# Patient Record
Sex: Male | Born: 1980 | Race: White | Hispanic: No | Smoking: Current every day smoker
Health system: Southern US, Community
[De-identification: ages and names within clinical notes are randomized; demographics above are authoritative.]

## PROBLEM LIST (undated history)

## (undated) HISTORY — PX: TONSILLECTOMY: SUR1361

## (undated) HISTORY — PX: HIP SURGERY: SHX245

## (undated) HISTORY — PX: KNEE SURGERY: SHX244

---

## 2001-01-28 ENCOUNTER — Emergency Department (HOSPITAL_COMMUNITY): Admission: EM | Admit: 2001-01-28 | Discharge: 2001-01-28 | Payer: Self-pay | Admitting: *Deleted

## 2001-01-28 ENCOUNTER — Encounter: Payer: Self-pay | Admitting: *Deleted

## 2001-05-02 ENCOUNTER — Emergency Department (HOSPITAL_COMMUNITY): Admission: EM | Admit: 2001-05-02 | Discharge: 2001-05-02 | Payer: Self-pay | Admitting: Emergency Medicine

## 2001-12-25 ENCOUNTER — Encounter: Payer: Self-pay | Admitting: *Deleted

## 2001-12-25 ENCOUNTER — Emergency Department (HOSPITAL_COMMUNITY): Admission: EM | Admit: 2001-12-25 | Discharge: 2001-12-25 | Payer: Self-pay | Admitting: *Deleted

## 2002-08-31 ENCOUNTER — Emergency Department (HOSPITAL_COMMUNITY): Admission: EM | Admit: 2002-08-31 | Discharge: 2002-09-01 | Payer: Self-pay | Admitting: Emergency Medicine

## 2003-06-17 ENCOUNTER — Encounter: Payer: Self-pay | Admitting: Emergency Medicine

## 2003-06-17 ENCOUNTER — Emergency Department (HOSPITAL_COMMUNITY): Admission: EM | Admit: 2003-06-17 | Discharge: 2003-06-17 | Payer: Self-pay | Admitting: Emergency Medicine

## 2004-04-03 ENCOUNTER — Emergency Department (HOSPITAL_COMMUNITY): Admission: EM | Admit: 2004-04-03 | Discharge: 2004-04-03 | Payer: Self-pay | Admitting: Emergency Medicine

## 2004-04-08 ENCOUNTER — Emergency Department (HOSPITAL_COMMUNITY): Admission: EM | Admit: 2004-04-08 | Discharge: 2004-04-08 | Payer: Self-pay | Admitting: Emergency Medicine

## 2004-06-02 ENCOUNTER — Emergency Department (HOSPITAL_COMMUNITY): Admission: EM | Admit: 2004-06-02 | Discharge: 2004-06-02 | Payer: Self-pay | Admitting: *Deleted

## 2004-06-08 ENCOUNTER — Emergency Department (HOSPITAL_COMMUNITY): Admission: EM | Admit: 2004-06-08 | Discharge: 2004-06-09 | Payer: Self-pay

## 2004-06-22 ENCOUNTER — Emergency Department (HOSPITAL_COMMUNITY): Admission: EM | Admit: 2004-06-22 | Discharge: 2004-06-22 | Payer: Self-pay

## 2004-08-21 ENCOUNTER — Emergency Department (HOSPITAL_COMMUNITY): Admission: EM | Admit: 2004-08-21 | Discharge: 2004-08-21 | Payer: Self-pay | Admitting: *Deleted

## 2006-07-26 IMAGING — CR DG CHEST 2V
3 series · 3 of 3 positions shown · non-contrast
Comparison: none

CLINICAL DATA: Chest pain

CHEST - TWO VIEW
FINDINGS
No previous for comparison. Lungs are clear. Heart size normal. Mild central peribronchial
thickening. No effusion.

[view not recorded (1 of 3)]
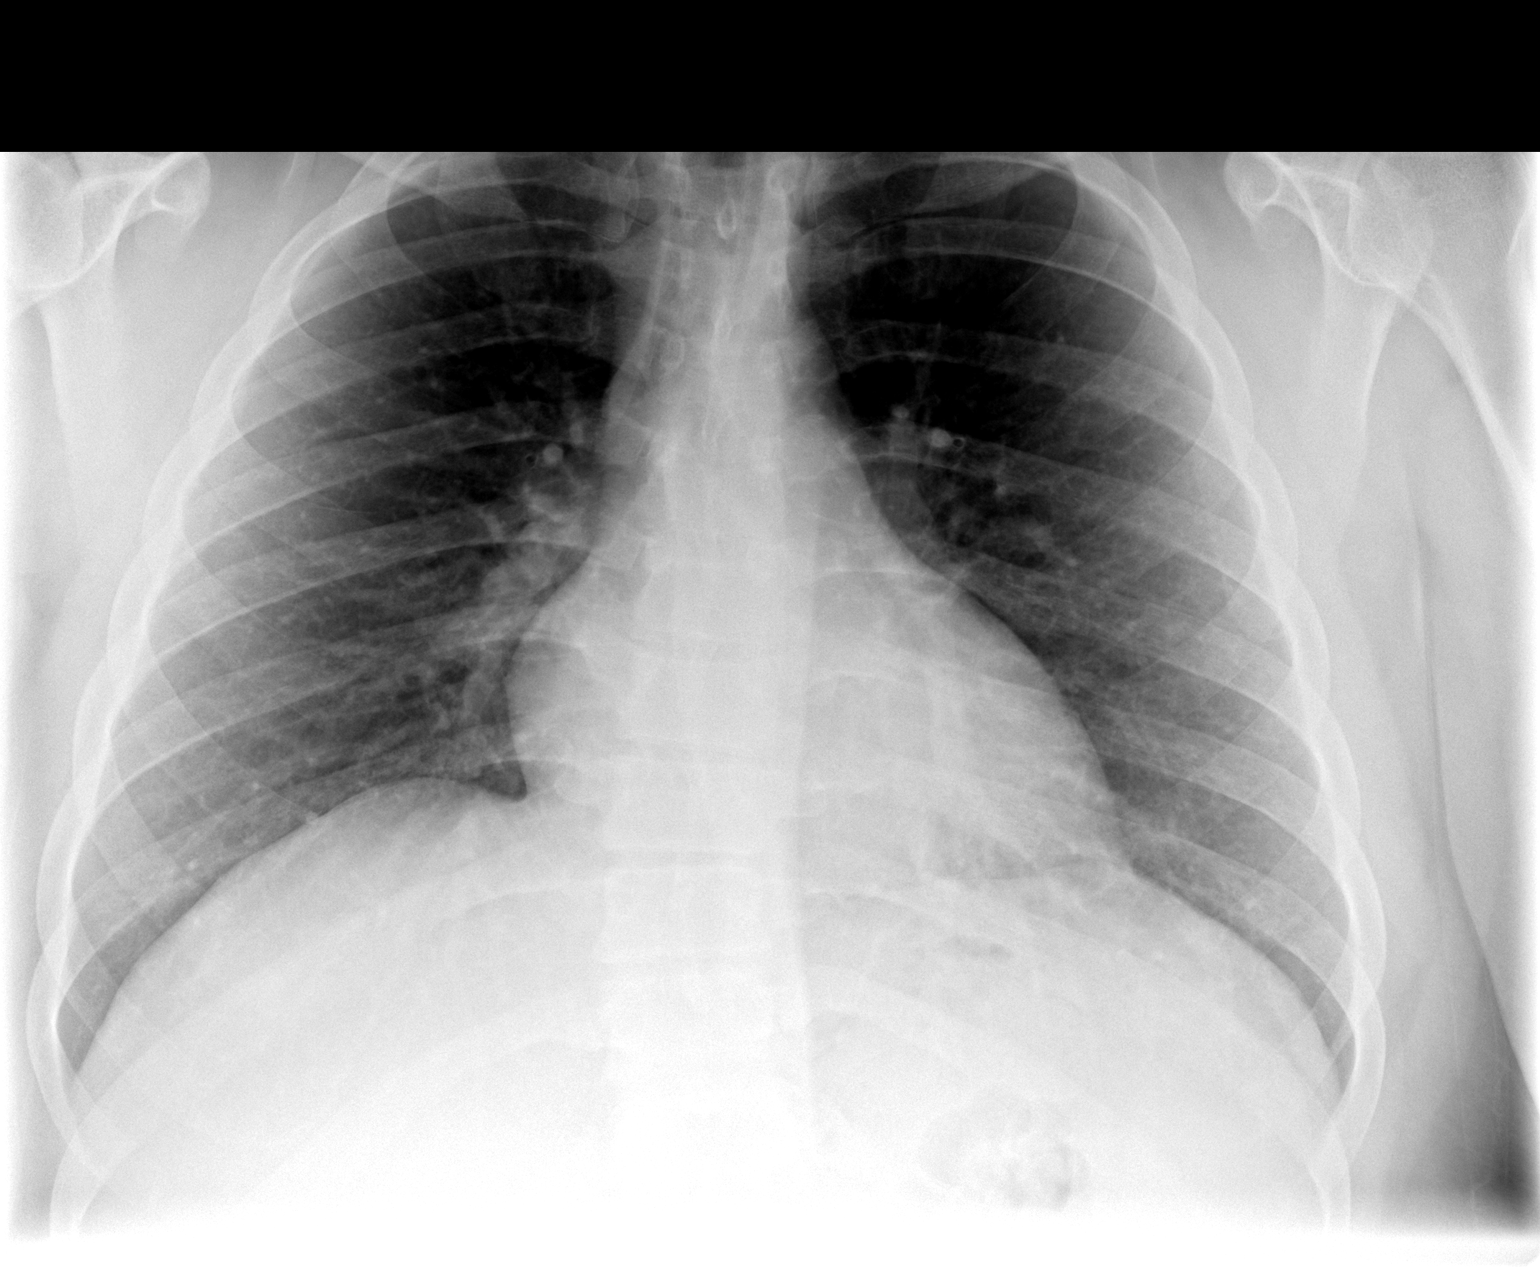

[view not recorded (2 of 3)]
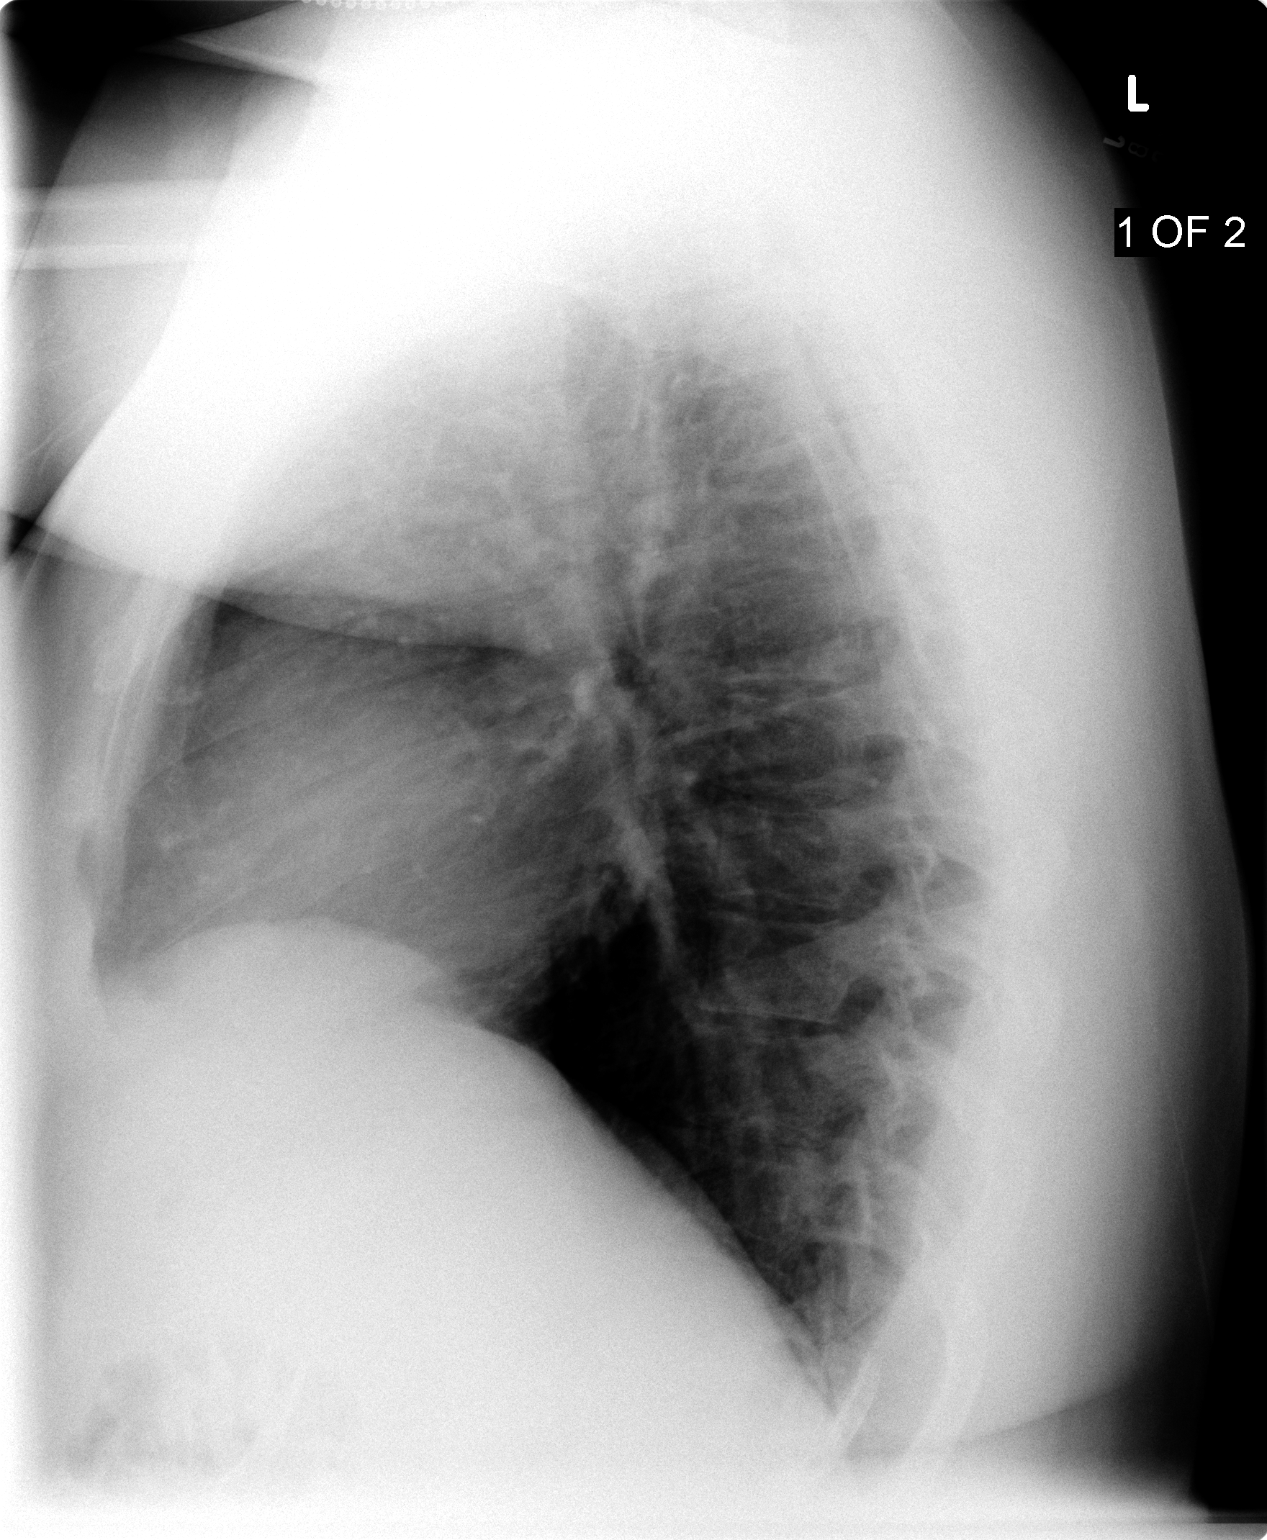

[view not recorded (3 of 3)]
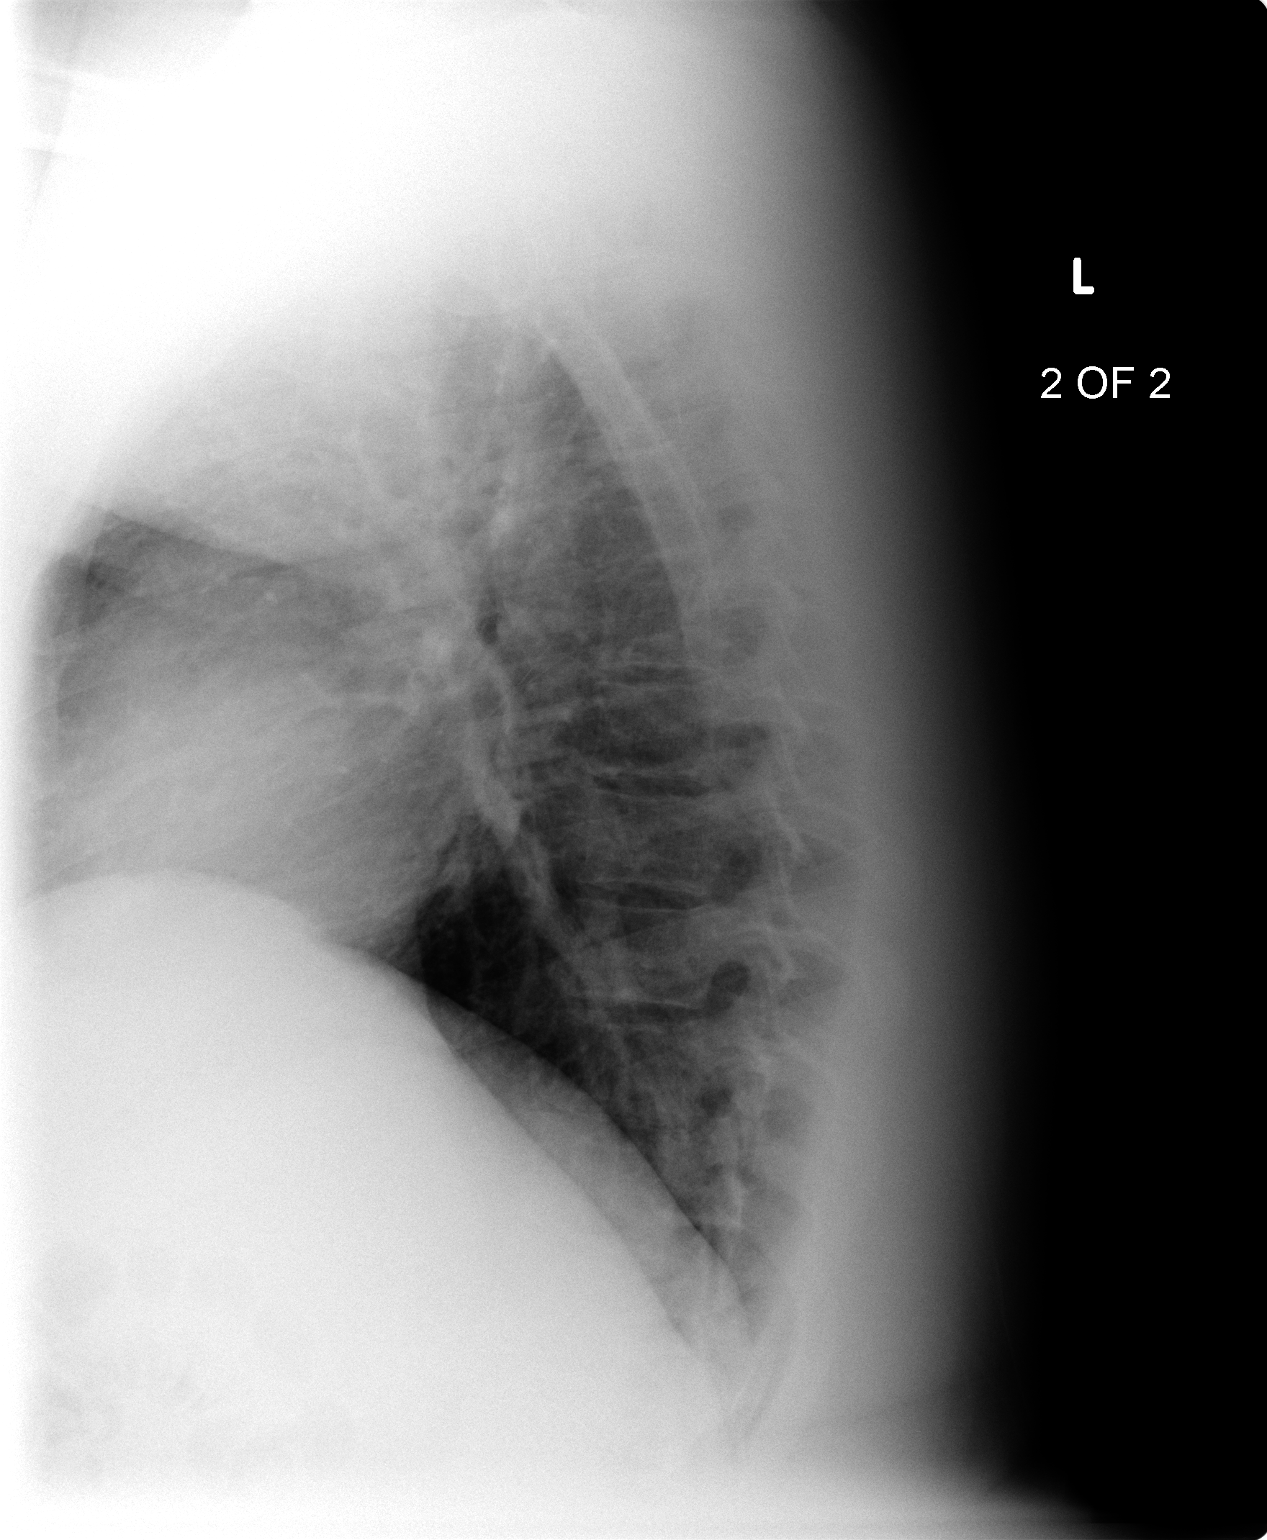

[3 of 3 positions shown; findings below may reference images not displayed]

IMPRESSION: Mild central peribronchial disease without airspace infiltrate.

## 2006-09-24 IMAGING — CR DG WRIST COMPLETE 3+V*R*
3 series · 3 of 3 positions shown · non-contrast
Comparison: none

CLINICAL DATA: Fell, wrist pain.
 RIGHT WRIST-THREE VIEWS:
 Soft tissues appear normal.  No fracture or dislocation is seen.

[view not recorded (1 of 3)]
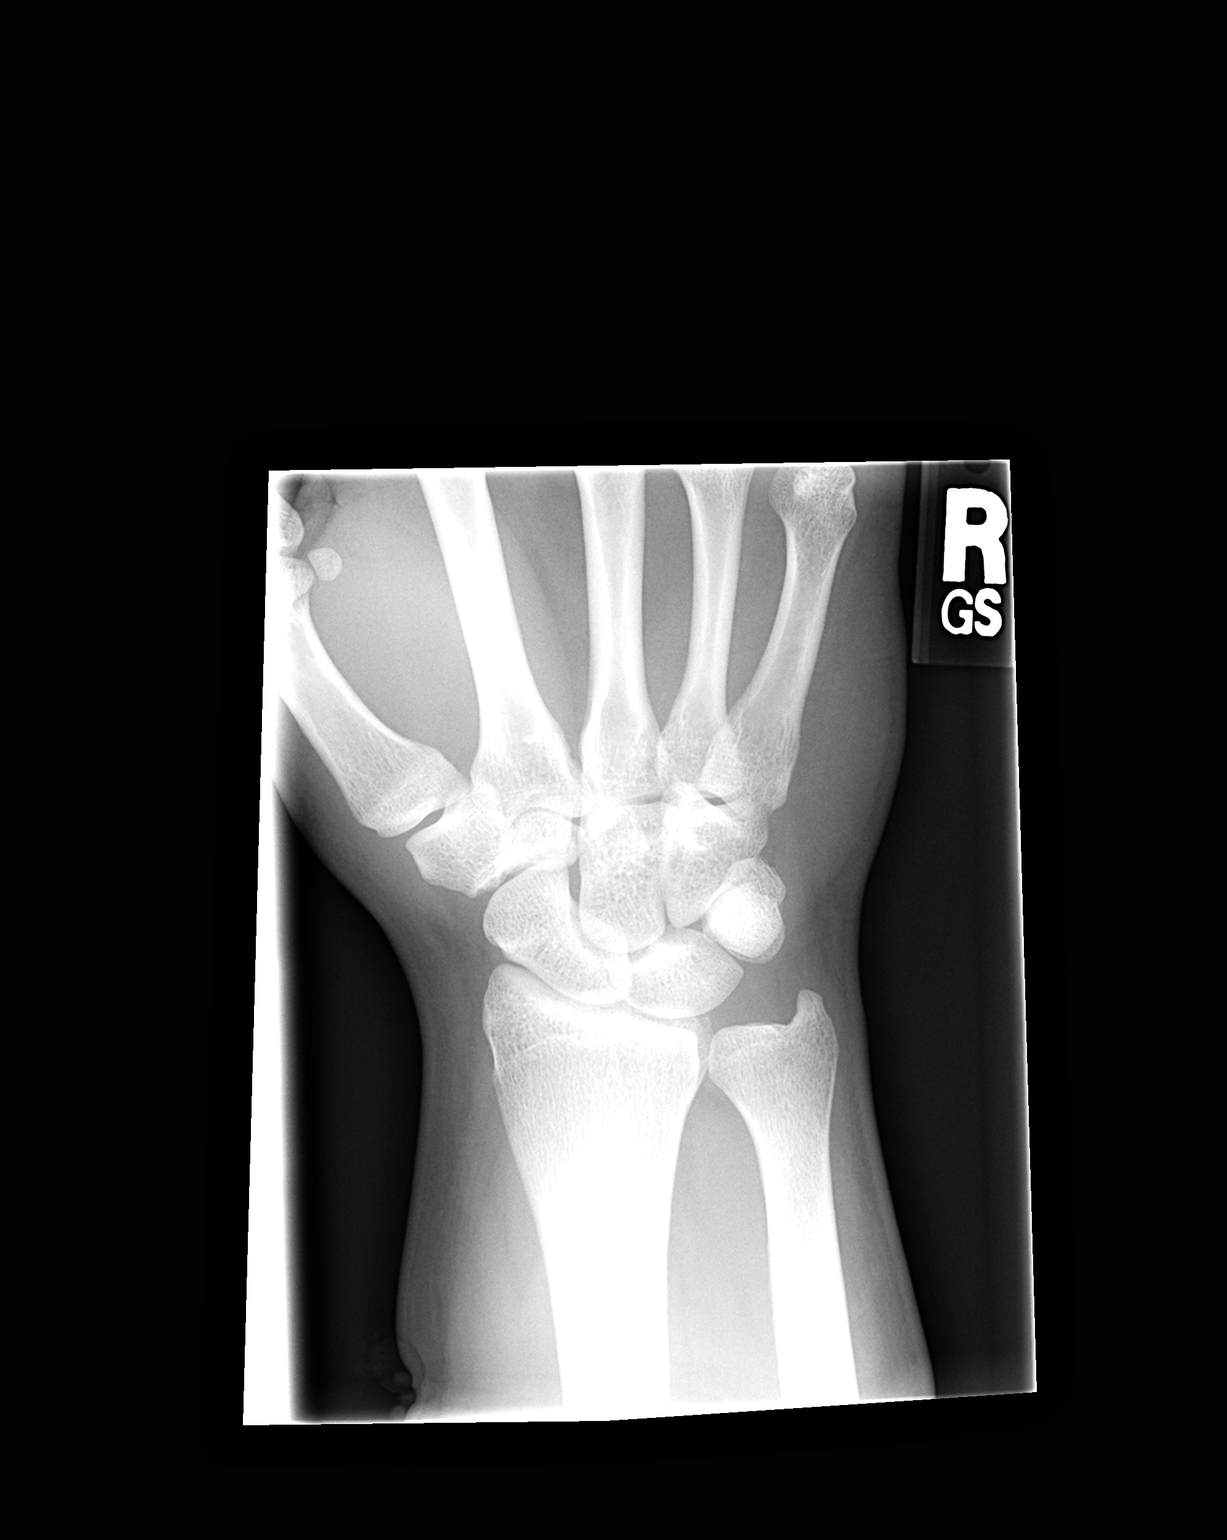

[view not recorded (2 of 3)]
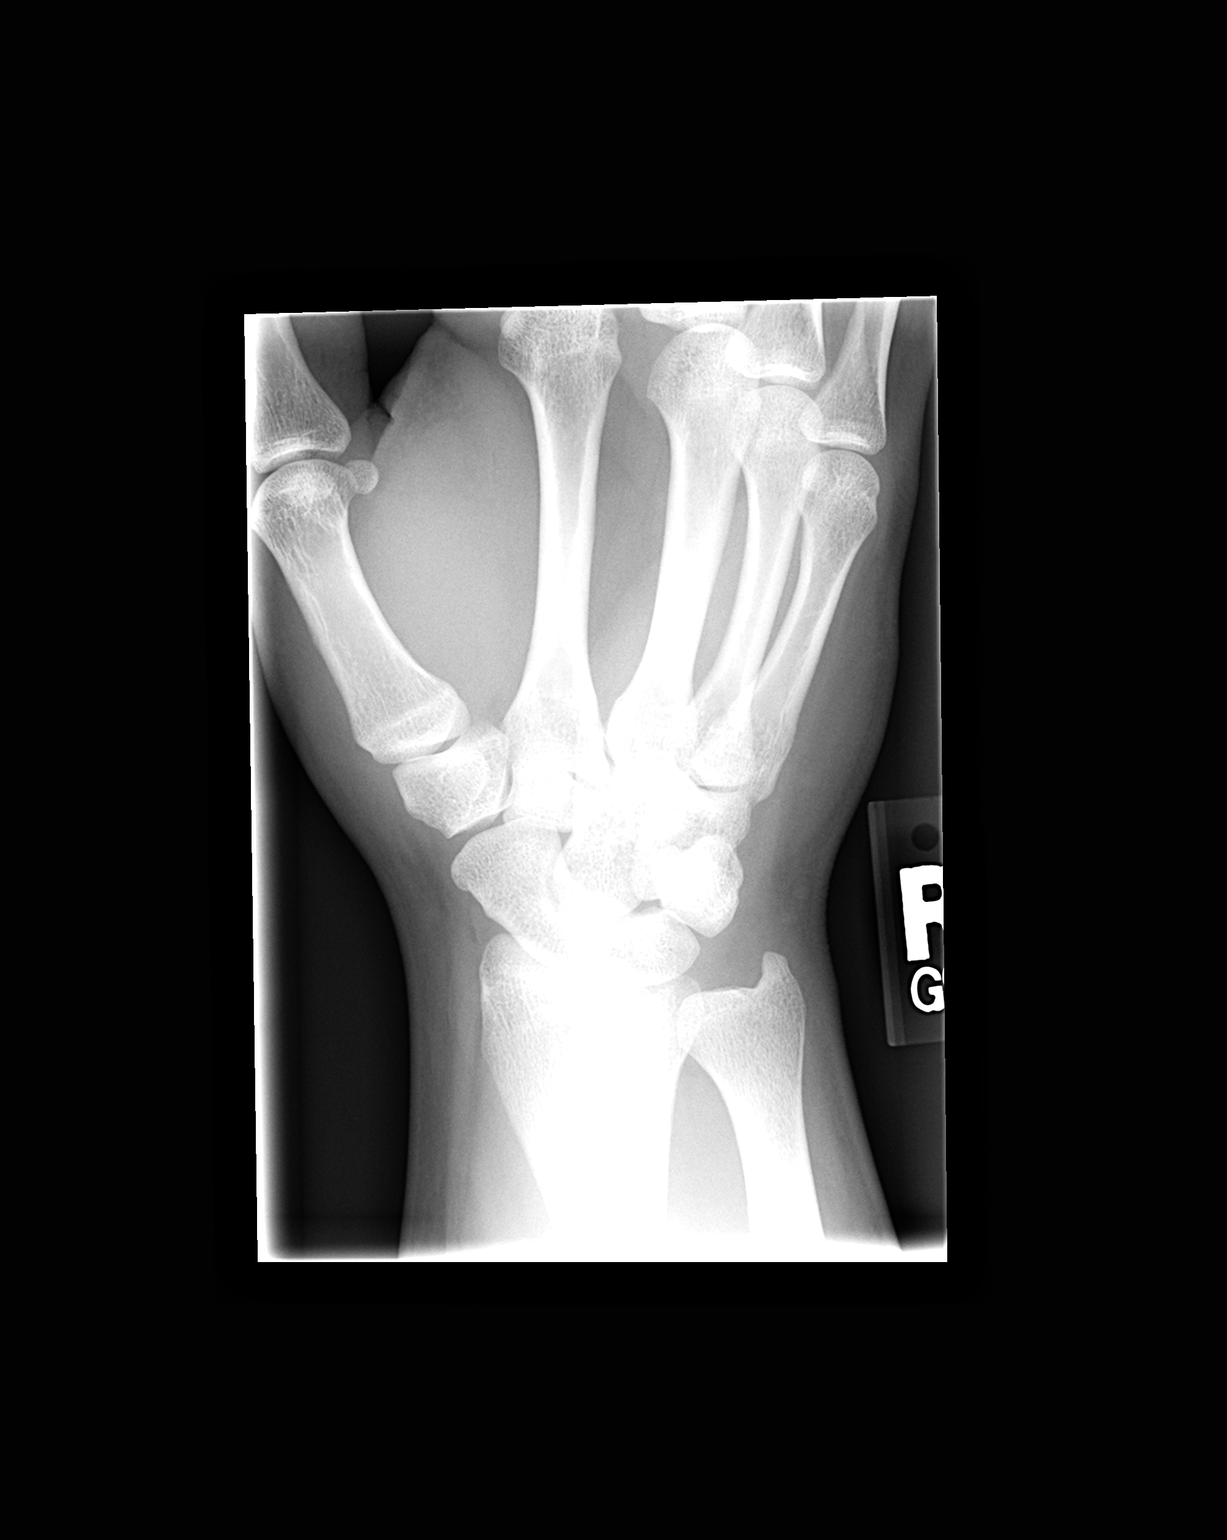

[view not recorded (3 of 3)]
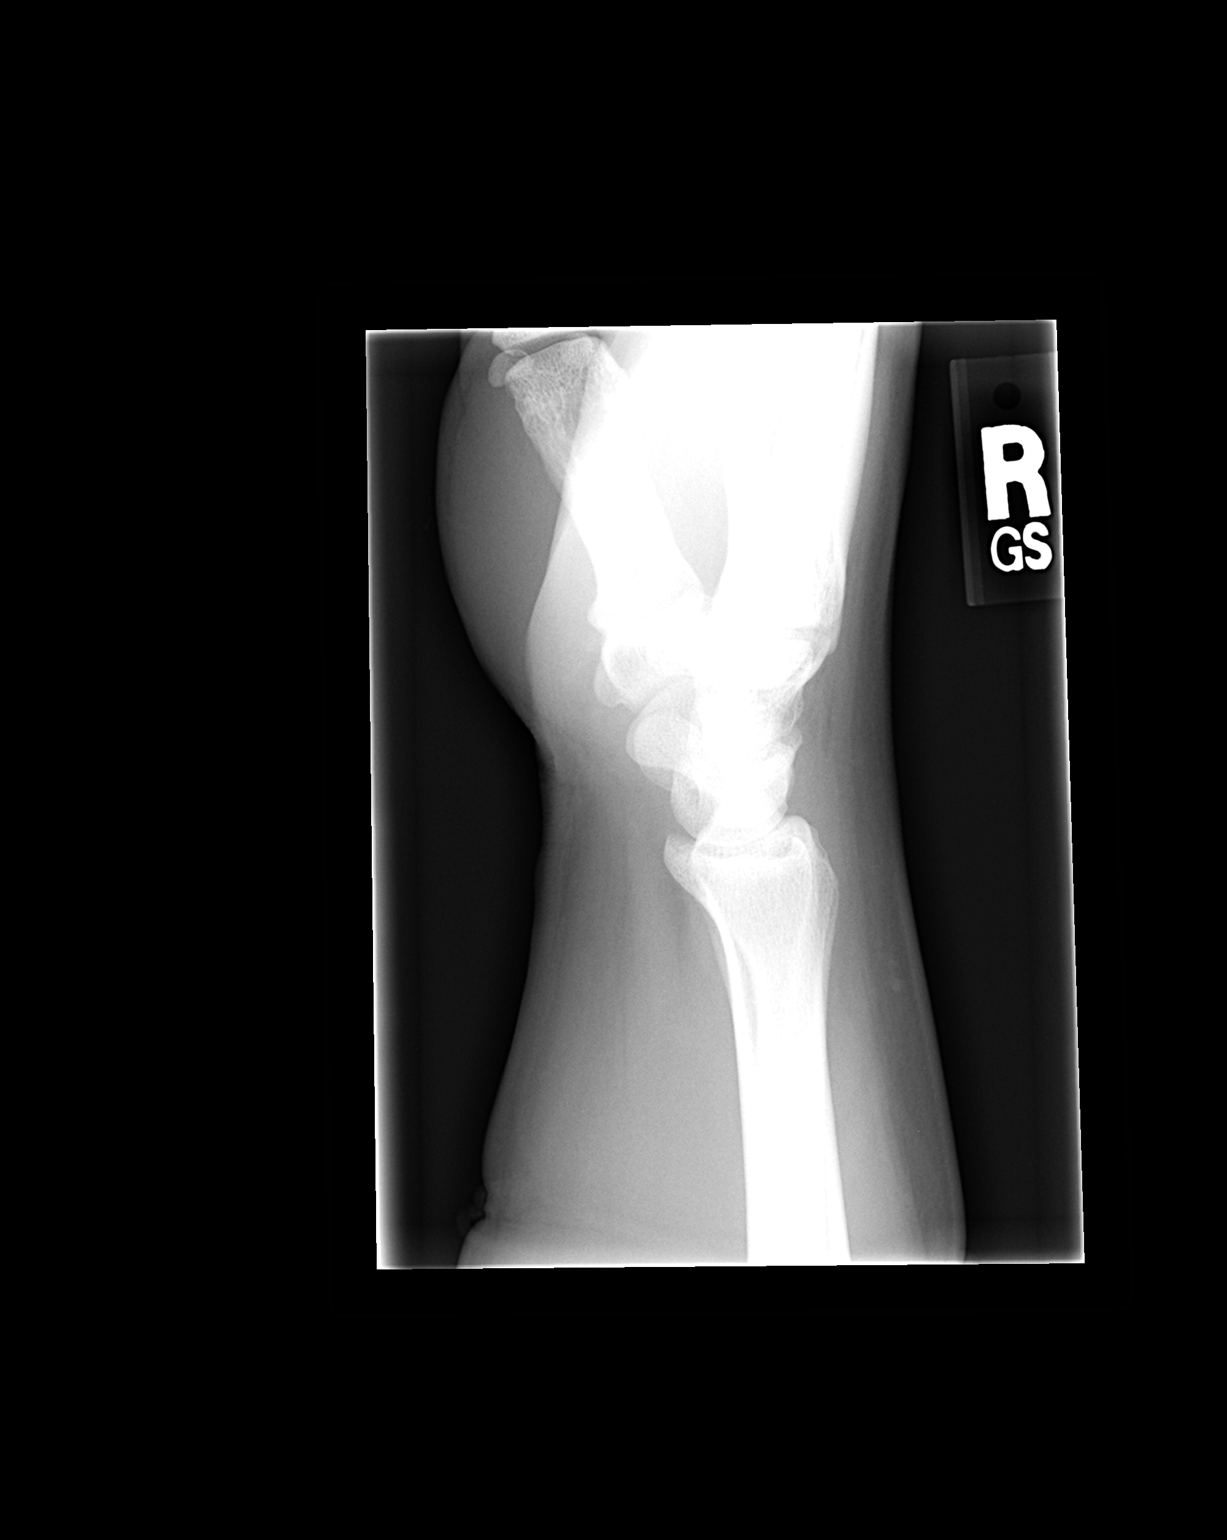

[3 of 3 positions shown; findings below may reference images not displayed]

IMPRESSION: No convincing evidence for fracture.  If there continues to be clinical concern, suggest immobilization and repeating the films 10-14 days after the injury,.

## 2007-11-21 ENCOUNTER — Emergency Department (HOSPITAL_COMMUNITY): Admission: EM | Admit: 2007-11-21 | Discharge: 2007-11-21 | Payer: Self-pay | Admitting: Emergency Medicine

## 2007-11-23 ENCOUNTER — Emergency Department (HOSPITAL_COMMUNITY): Admission: EM | Admit: 2007-11-23 | Discharge: 2007-11-23 | Payer: Self-pay | Admitting: Emergency Medicine

## 2007-12-30 ENCOUNTER — Emergency Department (HOSPITAL_COMMUNITY): Admission: EM | Admit: 2007-12-30 | Discharge: 2007-12-30 | Payer: Self-pay | Admitting: Emergency Medicine

## 2008-01-13 ENCOUNTER — Emergency Department (HOSPITAL_COMMUNITY): Admission: EM | Admit: 2008-01-13 | Discharge: 2008-01-13 | Payer: Self-pay | Admitting: Emergency Medicine

## 2011-07-31 ENCOUNTER — Emergency Department (HOSPITAL_COMMUNITY)
Admission: EM | Admit: 2011-07-31 | Discharge: 2011-07-31 | Disposition: A | Discharge status: Home / Self Care | Payer: Self-pay | Attending: Emergency Medicine | Admitting: Emergency Medicine

## 2011-07-31 ENCOUNTER — Encounter: Payer: Self-pay | Admitting: Emergency Medicine

## 2011-07-31 DIAGNOSIS — R059 Cough, unspecified: Secondary | ICD-10-CM | POA: Insufficient documentation

## 2011-07-31 DIAGNOSIS — J40 Bronchitis, not specified as acute or chronic: Secondary | ICD-10-CM | POA: Insufficient documentation

## 2011-07-31 DIAGNOSIS — H9209 Otalgia, unspecified ear: Secondary | ICD-10-CM | POA: Insufficient documentation

## 2011-07-31 DIAGNOSIS — J029 Acute pharyngitis, unspecified: Secondary | ICD-10-CM | POA: Insufficient documentation

## 2011-07-31 DIAGNOSIS — R05 Cough: Secondary | ICD-10-CM | POA: Insufficient documentation

## 2011-07-31 MED ORDER — AZITHROMYCIN 250 MG PO TABS: ORAL_TABLET | ORAL | Status: DC

## 2011-07-31 MED ORDER — ALBUTEROL SULFATE HFA 108 (90 BASE) MCG/ACT IN AERS
2.0000 | INHALATION_SPRAY | Freq: Once | RESPIRATORY_TRACT | Status: DC
  Filled 2011-07-31: qty 6.7

## 2011-07-31 MED ORDER — GUAIFENESIN-CODEINE 100-10 MG/5ML PO SYRP: 10.0000 mL | ORAL_SOLUTION | Freq: Three times a day (TID) | ORAL | Status: AC | PRN

## 2011-07-31 NOTE — ED Notes (Signed)
Nasal and sinus congestion, states he has greenish drainage

## 2011-07-31 NOTE — ED Provider Notes (Signed)
History     CSN: [id number] Arrival date & time: 07/31/2011  1:45 PM   First MD Initiated Contact with Patient 07/31/11 1438      Chief Complaint  Patient presents with  . Generalized Body Aches  . Cough  . Nasal Congestion  . Otalgia    (Consider location/radiation/quality/duration/timing/severity/associated sxs/prior treatment) Patient is a 30 y.o. male presenting with URI. The history is provided by the patient. The history is limited by a developmental delay.  URI The primary symptoms include ear pain, sore throat, cough, wheezing and myalgias. Primary symptoms do not include fever, fatigue, headaches, swollen glands, abdominal pain, nausea, vomiting, arthralgias or rash. Primary symptoms comment: nasal congestion The current episode started 3 to 5 days ago. This is a new problem. The problem has not changed since onset. The ear pain began more than 2 days ago. Ear pain is a new problem. Both ears are affected. The pain is mild.    The sore throat began more than 2 days ago. The sore throat has been unchanged since its onset. The sore throat is mild in intensity. The sore throat is not accompanied by trouble swallowing, drooling or stridor.  The cough began 3 to 5 days ago. The cough is new. The cough is productive. There is nondescript sputum produced.  Wheezing began more than 2 days ago. Wheezing occurs intermittently. The wheezing has been unchanged since its onset. The patient's medical history does not include asthma or chronic lung disease.  The myalgias are not associated with weakness.  The onset of the illness is associated with exposure to sick contacts. Symptoms associated with the illness include sinus pressure, congestion and rhinorrhea. The illness is not associated with chills or facial pain.    History reviewed. No pertinent past medical history.  Past Surgical History  Procedure Date  . Tonsillectomy     History reviewed. No pertinent family  history.  History  Substance Use Topics  . Smoking status: Current Everyday Smoker    Types: Cigarettes  . Smokeless tobacco: Not on file  . Alcohol Use: No      Review of Systems  Constitutional: Negative for fever, chills and fatigue.  HENT: Positive for ear pain, congestion, sore throat, rhinorrhea and sinus pressure. Negative for facial swelling, drooling, trouble swallowing, neck pain and neck stiffness.   Eyes: Negative for redness and visual disturbance.  Respiratory: Positive for cough and wheezing. Negative for shortness of breath and stridor.   Cardiovascular: Negative for chest pain and palpitations.  Gastrointestinal: Negative for nausea, vomiting and abdominal pain.  Genitourinary: Negative for dysuria and flank pain.  Musculoskeletal: Positive for myalgias. Negative for back pain, joint swelling, arthralgias and gait problem.  Skin: Negative for rash.  Neurological: Negative for dizziness, facial asymmetry, speech difficulty, weakness, numbness and headaches.  Hematological: Negative for adenopathy. Does not bruise/bleed easily.    Allergies  Bee and Dextrans  Home Medications   Current Outpatient Rx  Name Route Sig Dispense Refill  . ACETAMINOPHEN 500 MG PO TABS Oral Take 1,000 mg by mouth every 6 (six) hours as needed. For pain     . NAPROXEN SODIUM 220 MG PO TABS Oral Take 220 mg by mouth every 8 (eight) hours as needed. For pain       BP 144/79  Pulse 75  Temp(Src) 97.9 F (36.6 C) (Oral)  Resp 20  Ht 6\' 1"  (1.854 m)  Wt 253 lb (114.76 kg)  BMI 33.38 kg/m2  SpO2 96%  Physical  Exam  Nursing note and vitals reviewed. Constitutional: He is oriented to person, place, and time. He appears well-developed and well-nourished. No distress.  HENT:  Head: Normocephalic and atraumatic.  Right Ear: Tympanic membrane normal. No mastoid tenderness. No middle ear effusion. No hemotympanum.  Left Ear: Tympanic membrane normal. No mastoid tenderness.  No middle  ear effusion. No hemotympanum.  Nose: Mucosal edema and rhinorrhea present.  Mouth/Throat: Uvula is midline, oropharynx is clear and moist and mucous membranes are normal.  Neck: Trachea normal and normal range of motion. Neck supple. No Brudzinski's sign and no Kernig's sign noted. No thyromegaly present.  Cardiovascular: Normal rate, regular rhythm and normal heart sounds.   Pulmonary/Chest: Effort normal. No respiratory distress. He has wheezes. He has no rhonchi. He has no rales. Chest wall is not dull to percussion. He exhibits no tenderness and no crepitus.  Abdominal: Soft. He exhibits no distension. There is no tenderness.  Musculoskeletal: Normal range of motion. He exhibits no tenderness.  Lymphadenopathy:    He has no cervical adenopathy.  Neurological: He is alert and oriented to person, place, and time. No cranial nerve deficit. He exhibits normal muscle tone. Coordination normal.  Skin: Skin is warm and dry.  Psychiatric: He has a normal mood and affect.    ED Course  Procedures (including critical care time)       MDM      3:49 PM patient is alert, NAD.  Non-toxic appearing.  No focal neuro deficits, no meningeal signs. Coarse lungs sounds throughout, few expir wheezes.  No rales, no hypoxia or fever.    Patient / Family / Caregiver understand and agree with initial ED impression and plan with expectations set for ED visit.   OUTPATIENT MEDICATIONS PRESCRIBED FROM THE ED:   New Prescriptions   AZITHROMYCIN (ZITHROMAX Z-PAK) 250 MG TABLET    Take two tablets on day one, then one tab qd days 2-5   GUAIFENESIN-CODEINE (ROBITUSSIN AC) 100-10 MG/5ML SYRUP    Take 10 mLs by mouth 3 (three) times daily as needed for cough.       Khadeem Rockett L. Stasha Naraine, PA 08/02/11 1249

## 2011-07-31 NOTE — ED Notes (Signed)
Pt c/o body aches, congestion, cough, and bilateral ear pain since Friday night.

## 2011-08-03 NOTE — ED Provider Notes (Signed)
Medical screening examination/treatment/procedure(s) were performed by non-physician practitioner and as supervising physician I was immediately available for consultation/collaboration. Devoria Albe, MD, [facility]   Ward Givens, MD 08/03/11 [phone]

## 2016-11-27 ENCOUNTER — Emergency Department (HOSPITAL_COMMUNITY)
Admission: EM | Admit: 2016-11-27 | Discharge: 2016-11-27 | Disposition: A | Discharge status: Home / Self Care | Payer: Self-pay | Attending: Emergency Medicine | Admitting: Emergency Medicine

## 2016-11-27 ENCOUNTER — Encounter (HOSPITAL_COMMUNITY): Payer: Self-pay | Admitting: *Deleted

## 2016-11-27 DIAGNOSIS — Y929 Unspecified place or not applicable: Secondary | ICD-10-CM | POA: Insufficient documentation

## 2016-11-27 DIAGNOSIS — S060X1A Concussion with loss of consciousness of 30 minutes or less, initial encounter: Secondary | ICD-10-CM | POA: Insufficient documentation

## 2016-11-27 DIAGNOSIS — F17210 Nicotine dependence, cigarettes, uncomplicated: Secondary | ICD-10-CM | POA: Insufficient documentation

## 2016-11-27 DIAGNOSIS — W228XXA Striking against or struck by other objects, initial encounter: Secondary | ICD-10-CM | POA: Insufficient documentation

## 2016-11-27 DIAGNOSIS — F1290 Cannabis use, unspecified, uncomplicated: Secondary | ICD-10-CM | POA: Insufficient documentation

## 2016-11-27 DIAGNOSIS — Z91040 Latex allergy status: Secondary | ICD-10-CM | POA: Insufficient documentation

## 2016-11-27 DIAGNOSIS — Y9301 Activity, walking, marching and hiking: Secondary | ICD-10-CM | POA: Insufficient documentation

## 2016-11-27 DIAGNOSIS — B029 Zoster without complications: Secondary | ICD-10-CM | POA: Insufficient documentation

## 2016-11-27 DIAGNOSIS — Y999 Unspecified external cause status: Secondary | ICD-10-CM | POA: Insufficient documentation

## 2016-11-27 MED ORDER — ACYCLOVIR 400 MG PO TABS: 800.0000 mg | ORAL_TABLET | Freq: Every day | ORAL | 0 refills | Status: AC

## 2016-11-27 MED ORDER — FLUORESCEIN SODIUM 0.6 MG OP STRP
ORAL_STRIP | OPHTHALMIC | Status: AC
  Administered 2016-11-27: 04:00:00
  Filled 2016-11-27: qty 1

## 2016-11-27 MED ORDER — ONDANSETRON 8 MG PO TBDP
8.0000 mg | ORAL_TABLET | Freq: Once | ORAL | Status: AC
  Administered 2016-11-27: 8 mg via ORAL
  Filled 2016-11-27: qty 1

## 2016-11-27 MED ORDER — TETRACAINE HCL 0.5 % OP SOLN
OPHTHALMIC | Status: AC
  Administered 2016-11-27: 04:00:00
  Filled 2016-11-27: qty 4

## 2016-11-27 MED ORDER — HYDROCODONE-ACETAMINOPHEN 5-325 MG PO TABS
1.0000 | ORAL_TABLET | Freq: Once | ORAL | Status: AC
  Administered 2016-11-27: 1 via ORAL
  Filled 2016-11-27: qty 1

## 2016-11-27 NOTE — ED Notes (Signed)
Pt states understanding of care given and follow up instructions.  Pt ambulated from ED with SO to wait in lobby for ride from friend

## 2016-11-27 NOTE — ED Triage Notes (Signed)
Pt c/o sleep walking and states he has some unknown bumps on top of his head; pt also c/o pain or headache after hitting his head on the car

## 2016-11-27 NOTE — ED Provider Notes (Signed)
[facility]-EMERGENCY DEPT Provider Note   CSN: [id number] Arrival date & time: 11/27/16  0253     History   Chief Complaint Chief Complaint  Patient presents with  . Headache    HPI Jacob Tucker is a 36 y.o. male.  The history is provided by the patient.  Head Injury   The incident occurred 12 to 24 hours ago. He came to the ER via walk-in. The injury mechanism was a direct blow. He lost consciousness for a period of less than one minute. There was no blood loss. The quality of the pain is described as sharp. The pain is moderate. The pain has been constant since the injury. Associated symptoms include blurred vision and vomiting. Pertinent negatives include no disorientation and no weakness. Treatments tried: OTC meds. The treatment provided no relief.  Patient without any medical conditions presents with right sided HA due to hitting his head twice in the past 24 hrs He reports last night, he was sleepwalking when he woke up in the kitchen with a sore head due to getting hit in the head.  The headache continued all day  Later in the day he went to [facility] and was getting out of his car quickly when he hit his head "really hard" He reports brief LOC, he fell to this knees Since then his pain has worsened and he has vomited 4 hrs ago  No focal weakness He is ambulatory  Pt reports he sleep walks frequently He had otherwise felt well prior to sleepwalking   PMH -none Past Surgical History:  Procedure Laterality Date  . HIP SURGERY Right   . KNEE SURGERY Right   . TONSILLECTOMY         Home Medications    Prior to Admission medications   Not on File    Family History History reviewed. No pertinent family history.  Social History Social History  Substance Use Topics  . Smoking status: Current Every Day Smoker    Packs/day: 0.50    Types: Cigarettes  . Smokeless tobacco: Never Used  . Alcohol use No     Allergies   Dextrans; Nutritional supplements; Cylert  [pemoline]; Latex; and Penicillins   Review of Systems Review of Systems  Constitutional: Negative for fever.  Eyes: Positive for blurred vision and visual disturbance.  Cardiovascular: Negative for chest pain.  Gastrointestinal: Positive for vomiting.  Musculoskeletal: Negative for back pain.  Neurological: Negative for weakness.  All other systems reviewed and are negative.    Physical Exam Updated Vital Signs BP 122/73 (BP Location: Left Arm)   Pulse 74   Temp 97.8 F (36.6 C) (Oral)   Resp 18   Ht 6\' 1"  (1.854 m)   Wt 116.1 kg   SpO2 97%   BMI 33.78 kg/m   Physical Exam  CONSTITUTIONAL: Disheveled.  Smells of cigarettes HEAD: no crepitus.  No stepoffs.  He has tenderness to right scalp.  No lacerations noted.  He has small lump that is tender to palpation EYES: EOMI/PERRL ENMT: Mucous membranes moist, poor dentition NECK: supple no meningeal signs SPINE/BACK:entire spine nontender, mild cervical paraspinal tenderness CV: S1/S2 noted, no murmurs/rubs/gallops noted LUNGS: Lungs are clear to auscultation bilaterally, no apparent distress ABDOMEN: soft, nontender  NEURO: Pt is awake/alert/appropriate, moves all extremitiesx4.  No facial droop. No past pointing.  No arm drift.  No facial droop.  He is ambulatory.  No ataxia GCS - 15  EXTREMITIES: pulses normal/equal, full ROM SKIN: warm, color normal PSYCH: mildly  anxious  ED Treatments / Results  Labs (all labs ordered are listed, but only abnormal results are displayed) Labs Reviewed - No data to display  EKG  EKG Interpretation None       Radiology No results found.  Procedures Procedures (including critical care time)  Medications Ordered in ED Medications  ondansetron (ZOFRAN-ODT) disintegrating tablet 8 mg (8 mg Oral Given 11/27/16 0336)  HYDROcodone-acetaminophen (NORCO/VICODIN) 5-325 MG per tablet 1 tablet (1 tablet Oral Given 11/27/16 0336)  tetracaine (PONTOCAINE) 0.5 % ophthalmic solution (   Given by Other 11/27/16 0418)  fluorescein 0.6 MG ophthalmic strip (  Given 11/27/16 0419)     Initial Impression / Assessment and Plan / ED Course  I have reviewed the triage vital signs and the nursing notes.   Pt stable He has hit his head twice in past 24 hrs Pt otherwise healthy, not currently on anticoagulants, it is >8 hrs since he las hit his head and his mentation is appropriate, GCS 15, ambulatory, suspicion for acute ICH is low Probable concussion  4:35 AM Pt ambulatory No distress He also mentions rash to forehead that just started, he thinks it is due to a new hat.  He denies dying his hair recently (he has blue hair) On exam, he has rash only on right side of forehead in dermatomal pattern It spares the nose.  It is not actively draining I suspect this is early zoster He reports mild blurred vision Fluorescin exam performed - no dendritic lesions Currently I don't feel this involves his eye Visual acuity noted  Will start acyclovir (discount coupons given)  Discussed natural course of illness Advised strict hygiene as this can be contagious and should be avoided by those that are pregnant  Final Clinical Impressions(s) / ED Diagnoses   Final diagnoses:  Concussion with loss of consciousness of 30 minutes or less, initial encounter  Herpes zoster without complication    New Prescriptions New Prescriptions   ACYCLOVIR (ZOVIRAX) 400 MG TABLET    Take 2 tablets (800 mg total) by mouth 5 (five) times daily.     Zadie Rhine, MD 11/27/16 [phone]
